# Patient Record
Sex: Male | Born: 1980 | Race: White | Hispanic: No | Marital: Married
Health system: Southern US, Community
[De-identification: ages and names within clinical notes are randomized; demographics above are authoritative.]

## PROBLEM LIST (undated history)

## (undated) DIAGNOSIS — K219 Gastro-esophageal reflux disease without esophagitis: Secondary | ICD-10-CM

## (undated) DIAGNOSIS — I10 Essential (primary) hypertension: Secondary | ICD-10-CM

## (undated) DIAGNOSIS — G4733 Obstructive sleep apnea (adult) (pediatric): Secondary | ICD-10-CM

## (undated) HISTORY — DX: Obstructive sleep apnea (adult) (pediatric): G47.33

## (undated) HISTORY — PX: VASECTOMY: SHX75

## (undated) HISTORY — DX: Gastro-esophageal reflux disease without esophagitis: K21.9

## (undated) HISTORY — DX: Essential (primary) hypertension: I10

---

## 2003-02-06 ENCOUNTER — Emergency Department (HOSPITAL_COMMUNITY): Admission: EM | Admit: 2003-02-06 | Discharge: 2003-02-06 | Payer: Self-pay | Admitting: Emergency Medicine

## 2015-07-11 ENCOUNTER — Other Ambulatory Visit: Payer: Self-pay | Admitting: Family Medicine

## 2015-07-11 DIAGNOSIS — M25511 Pain in right shoulder: Secondary | ICD-10-CM

## 2015-07-15 HISTORY — PX: SHOULDER SURGERY: SHX246

## 2015-07-27 ENCOUNTER — Ambulatory Visit
Admission: RE | Admit: 2015-07-27 | Discharge: 2015-07-27 | Disposition: A | Discharge status: Home / Self Care | Payer: PRIVATE HEALTH INSURANCE | Source: Ambulatory Visit | Attending: Family Medicine | Admitting: Family Medicine

## 2015-07-27 DIAGNOSIS — M25511 Pain in right shoulder: Secondary | ICD-10-CM

## 2015-07-27 MED ORDER — IOHEXOL 180 MG/ML  SOLN
14.0000 mL | Freq: Once | INTRAMUSCULAR | Status: AC | PRN
  Administered 2015-07-27: 14 mL via INTRA_ARTICULAR

## 2015-07-31 ENCOUNTER — Other Ambulatory Visit: Payer: Self-pay | Admitting: Orthopedic Surgery

## 2015-07-31 DIAGNOSIS — M542 Cervicalgia: Secondary | ICD-10-CM

## 2015-08-03 ENCOUNTER — Ambulatory Visit
Admission: RE | Admit: 2015-08-03 | Discharge: 2015-08-03 | Disposition: A | Discharge status: Home / Self Care | Payer: PRIVATE HEALTH INSURANCE | Source: Ambulatory Visit | Attending: Orthopedic Surgery | Admitting: Orthopedic Surgery

## 2015-08-03 DIAGNOSIS — M542 Cervicalgia: Secondary | ICD-10-CM

## 2015-09-03 ENCOUNTER — Other Ambulatory Visit (HOSPITAL_COMMUNITY): Payer: Self-pay | Admitting: Orthopedic Surgery

## 2015-09-03 ENCOUNTER — Ambulatory Visit (HOSPITAL_COMMUNITY)
Admission: RE | Admit: 2015-09-03 | Discharge: 2015-09-03 | Disposition: A | Discharge status: Home / Self Care | Payer: PRIVATE HEALTH INSURANCE | Source: Ambulatory Visit | Attending: Orthopedic Surgery | Admitting: Orthopedic Surgery

## 2015-09-03 DIAGNOSIS — R52 Pain, unspecified: Secondary | ICD-10-CM

## 2015-09-03 DIAGNOSIS — M7989 Other specified soft tissue disorders: Secondary | ICD-10-CM | POA: Diagnosis not present

## 2015-09-03 DIAGNOSIS — M79605 Pain in left leg: Secondary | ICD-10-CM | POA: Diagnosis not present

## 2015-09-03 DIAGNOSIS — M79604 Pain in right leg: Secondary | ICD-10-CM | POA: Diagnosis not present

## 2015-09-03 DIAGNOSIS — R609 Edema, unspecified: Secondary | ICD-10-CM

## 2015-09-03 NOTE — Progress Notes (Signed)
VASCULAR LAB PRELIMINARY  PRELIMINARY  PRELIMINARY  PRELIMINARY  Bilateral lower extremity venous duplex completed.    Preliminary report:  Bilateral:  No evidence of DVT, superficial thrombosis, or Baker's Cyst.   Jamice Carreno, RVS 09/03/2015, 2:06 PM

## 2017-01-26 IMAGING — XA DG FLUORO GUIDE NDL PLC/BX
1 series · 5 of 5 positions shown · non-contrast
Comparison: none

CLINICAL DATA: Right shoulder pain.

[Series 1: ortho adipose · 5 of 5 slices shown]
[im 1/5]
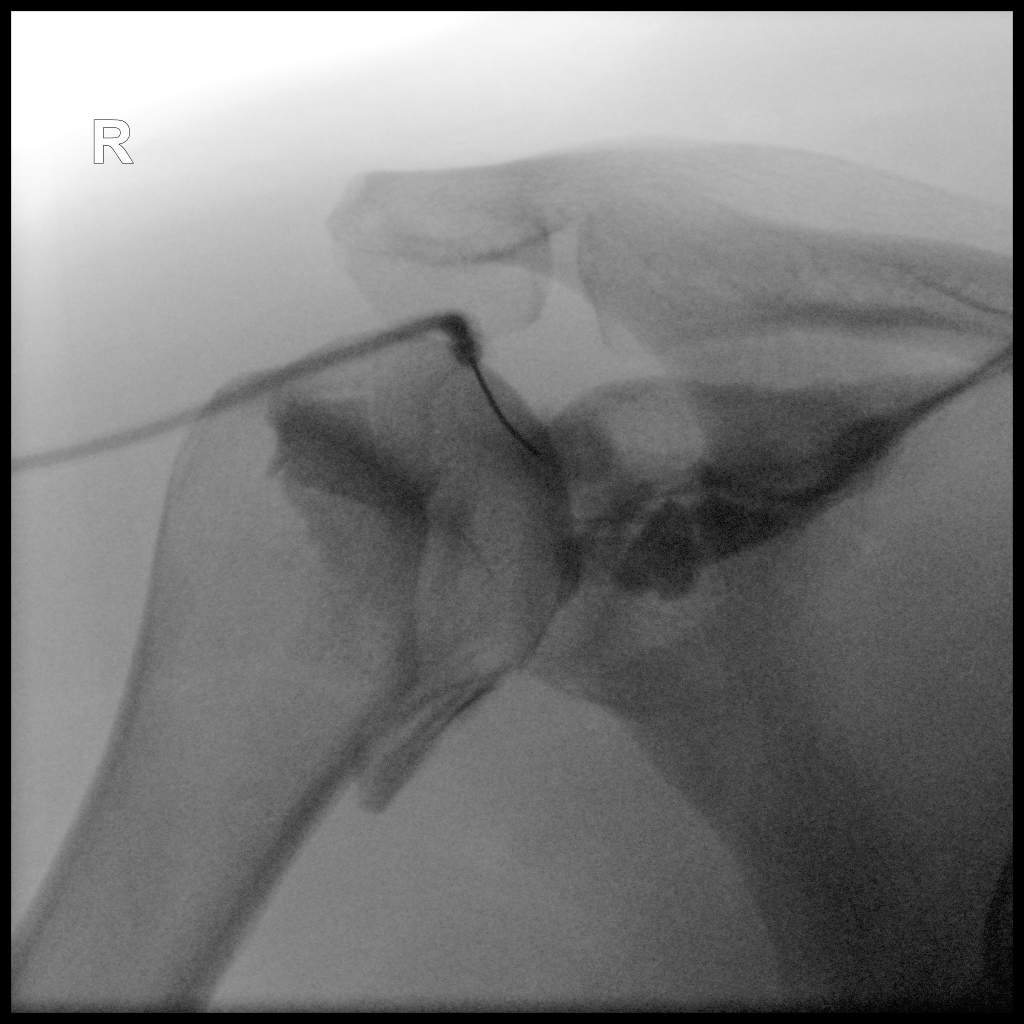
[im 2/5]
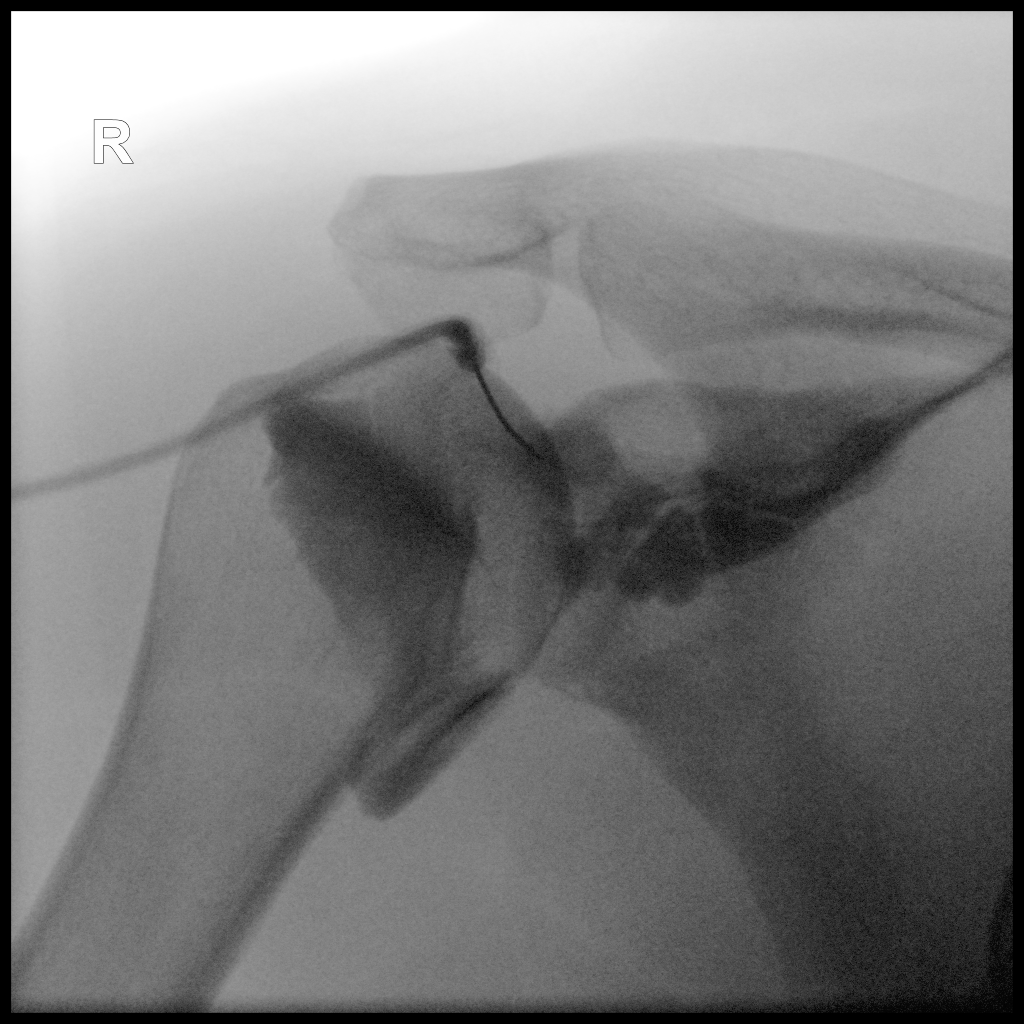
[im 3/5]
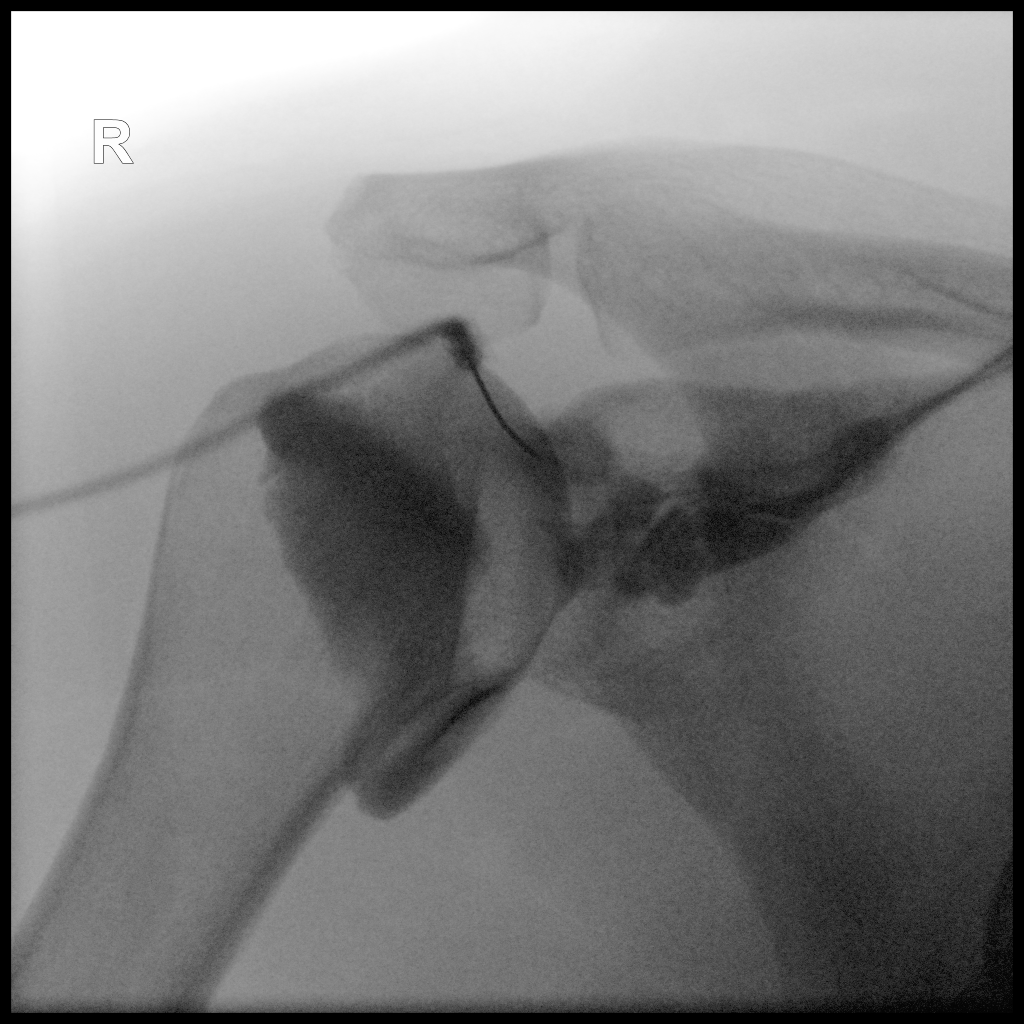
[im 4/5]
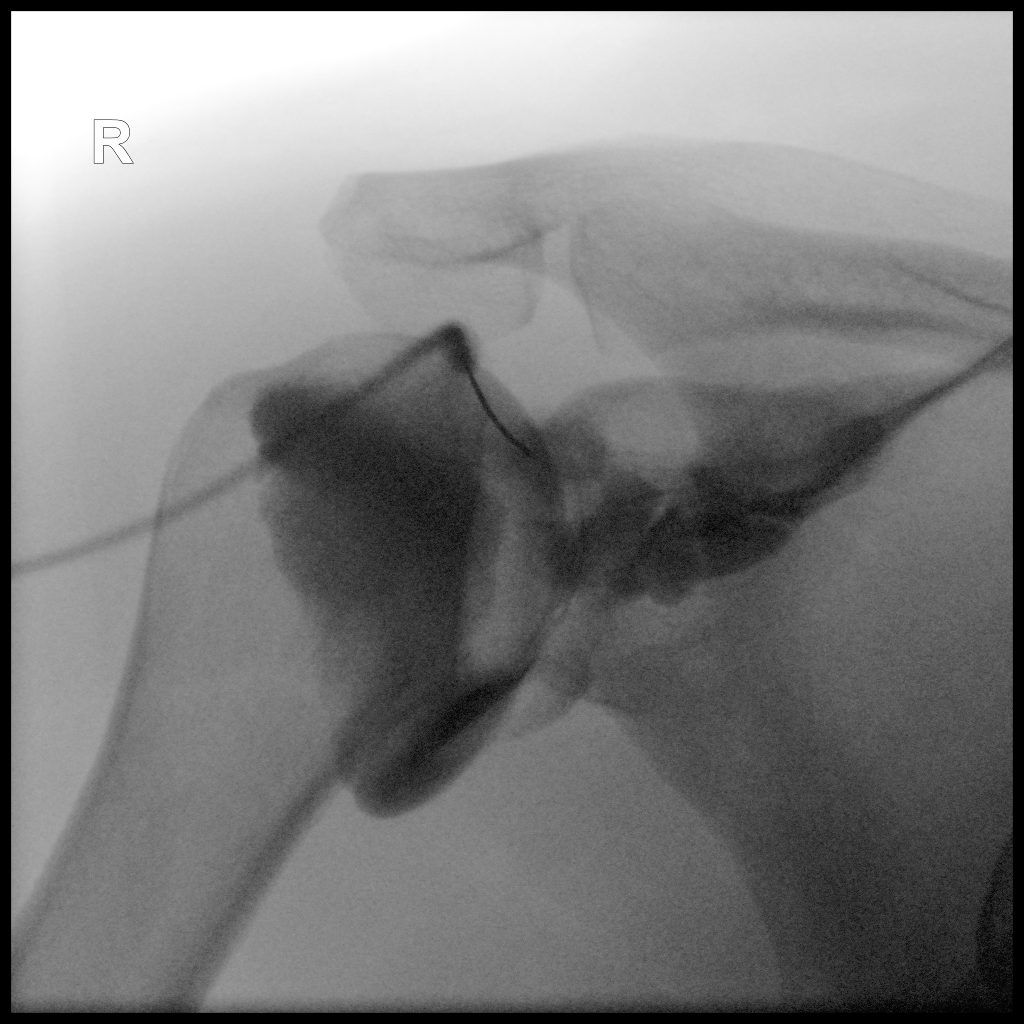
[im 5/5]
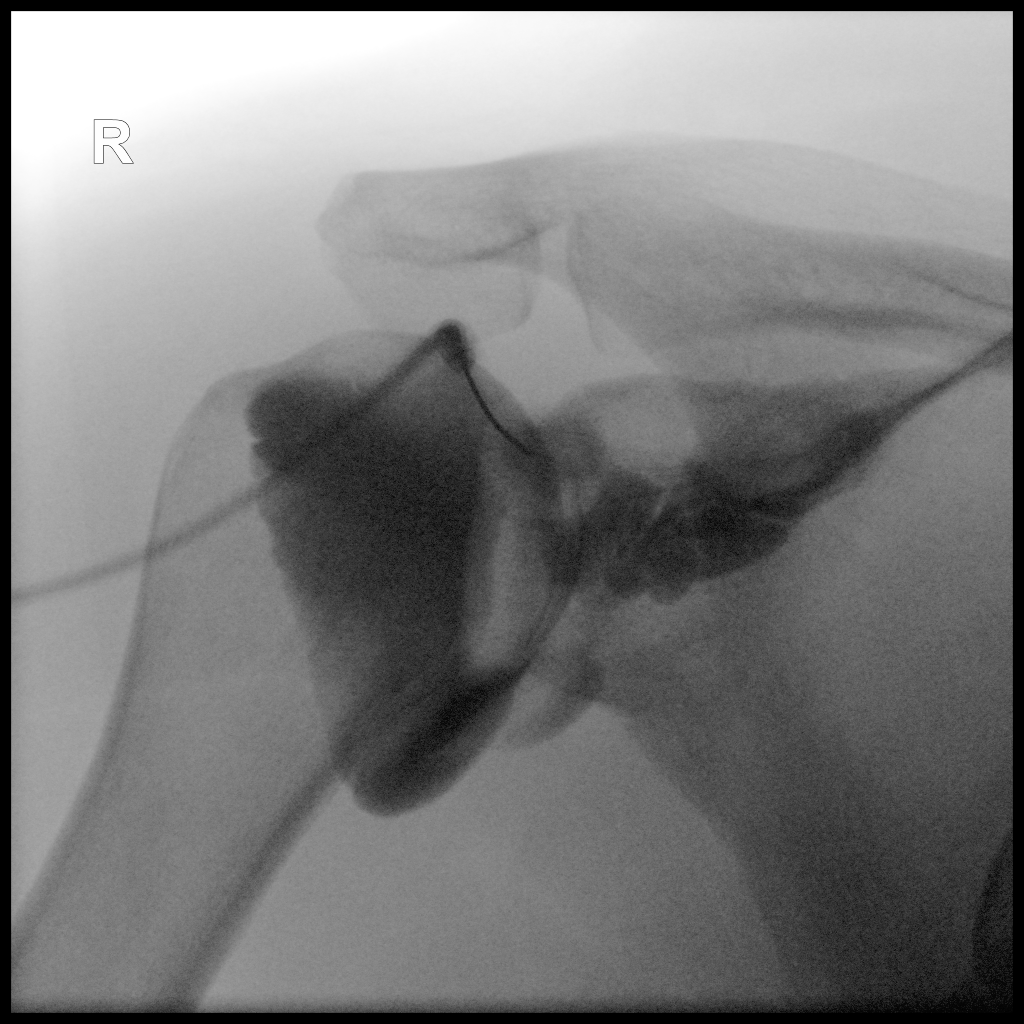

[5 of 5 positions shown; findings below may reference images not displayed]

FLUOROSCOPY TIME:  Radiation Exposure Index (as provided by the
fluoroscopic device): 25.90 microGray*m^2

Fluoroscopy Time (in minutes and seconds):  0 minutes 7 seconds

PROCEDURE:
RIGHT SHOULDER INJECTION UNDER FLUOROSCOPY

An appropriate skin entrance site was determined. The site was
marked, prepped with Betadine, draped in the usual sterile fashion,
and infiltrated locally with buffered Lidocaine. A 3.5 inch, 22
gauge spinal needle was advanced to the superomedial margin of the
humeral head under intermittent fluoroscopy. 1 ml of Lidocaine
injected easily. A mixture of 0.05 mL of MultiHance, 5 mL of 1%
lidocaine, and 15 mL of Isovue-M 200 was then used to opacify the
right shoulder capsule. 14 mL total of this mixture was injected. No
immediate complication.
IMPRESSION: Technically successful right shoulder injection for MRI.

## 2017-01-26 IMAGING — MR MR SHOULDER*R* W/CM
6 series · 40 of 40 positions shown · IV contrast (agent unspecified)
Comparison: Injection images same date.  Radiographs 06/20/2015.

CLINICAL DATA: Shoulder pain with motion for 6-8 months. History of
right pectoralis muscle tear and heavy weight lifting. No previous
relevant surgery.

EXAM:
MR ARTHROGRAM OF THE RIGHT SHOULDER
TECHNIQUE: Multiplanar, multisequence MR imaging of the right shoulder was
performed following the administration of intra-articular contrast.
CONTRAST:  See Injection Documentation.

[Series 4: T1 fat-sat · axial · 4.0mm · 0.27mm/px · z∈[+34,+114]mm · 6 of 18 slices shown (1 of 4)]
[im 1/18]
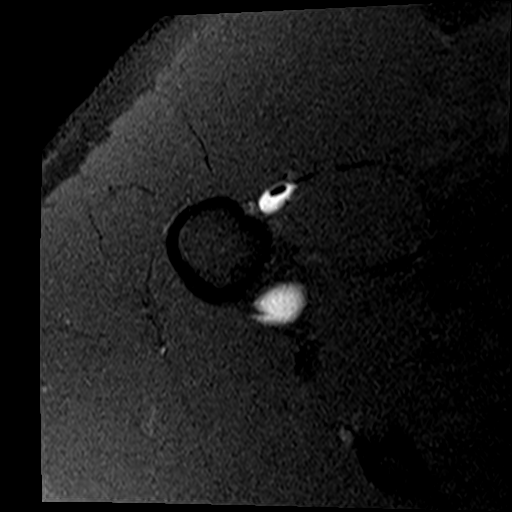
[im 4/18]
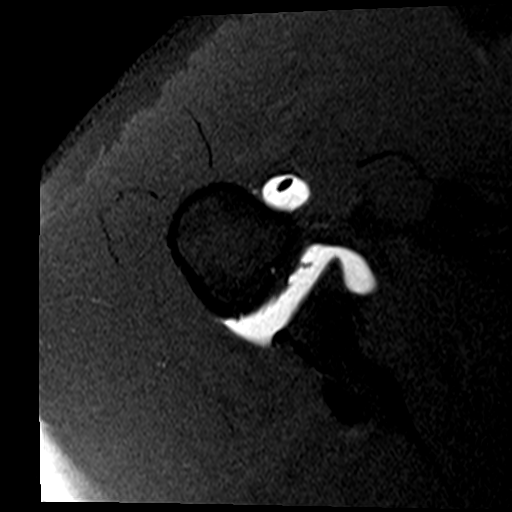
[im 7/18]
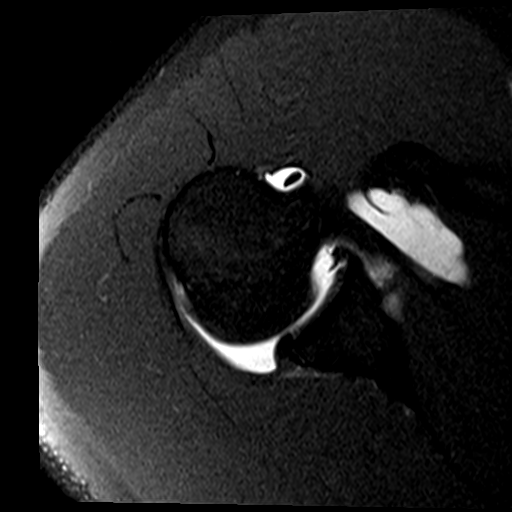
[im 11/18]
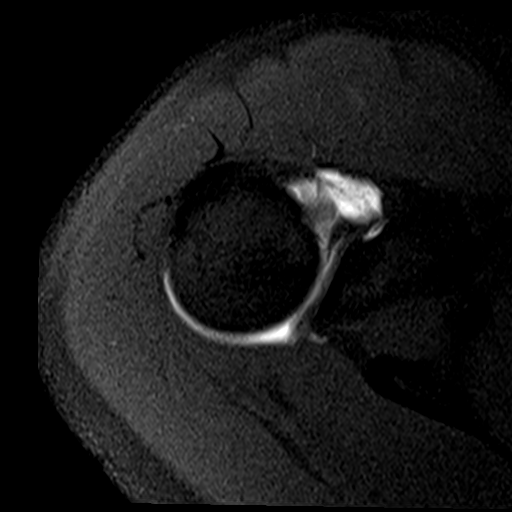
[im 14/18]
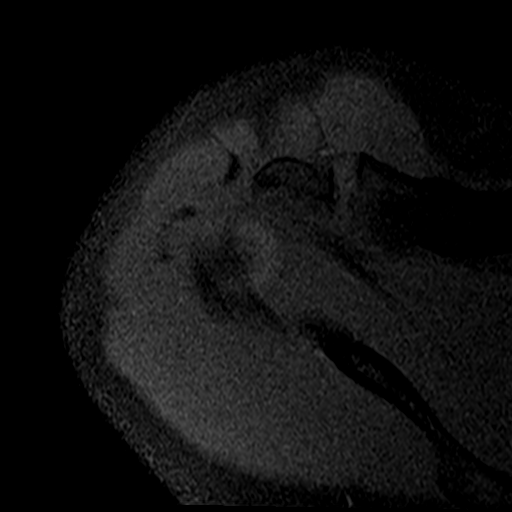
[im 18/18]
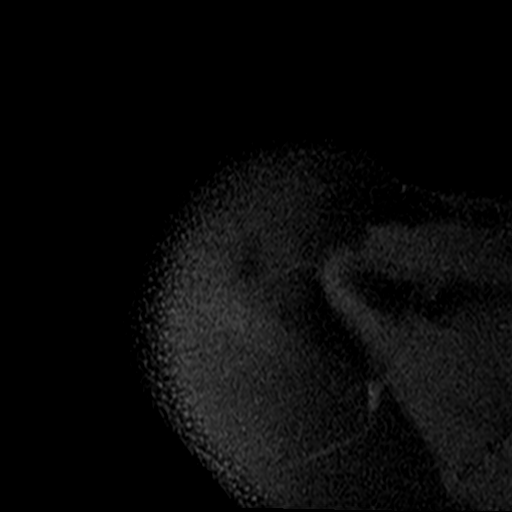

[Series 5: T2 fat-sat · oblique · 4.0mm · 0.55mm/px · 7 of 19 slices shown (1 of 2)]
[im 1/19]
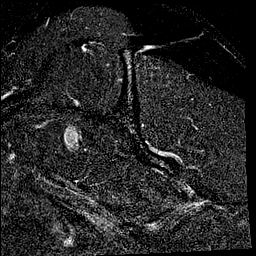
[im 4/19]
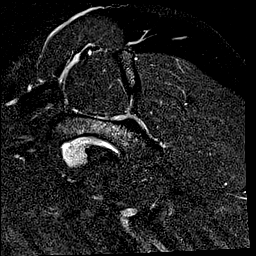
[im 7/19]
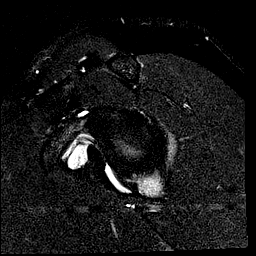
[im 10/19]
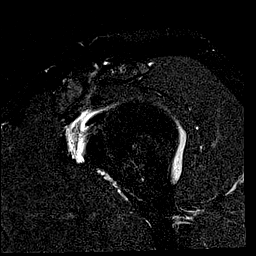
[im 13/19]
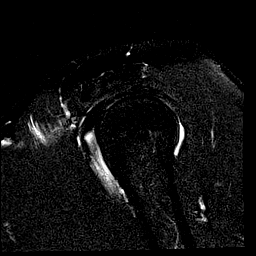
[im 16/19]
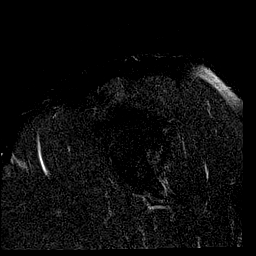
[im 19/19]
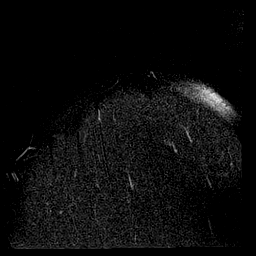

[Series 6: T1 fat-sat · oblique · 4.0mm · 0.44mm/px · 7 of 17 slices shown (2 of 4)]
[im 1/17]
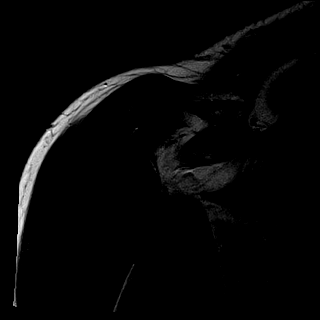
[im 3/17]
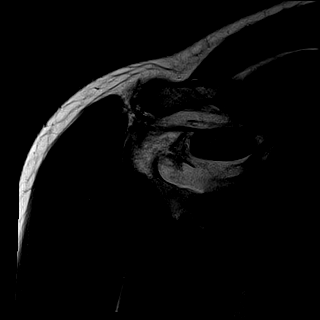
[im 6/17]
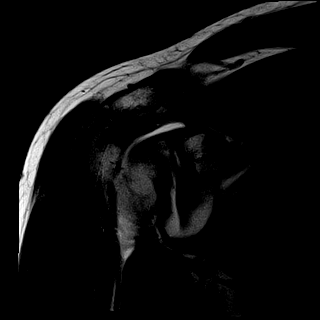
[im 9/17]
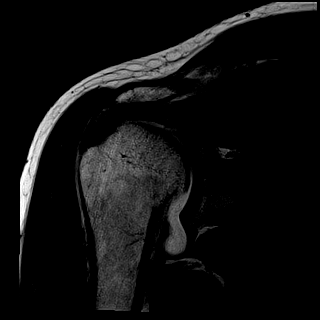
[im 11/17]
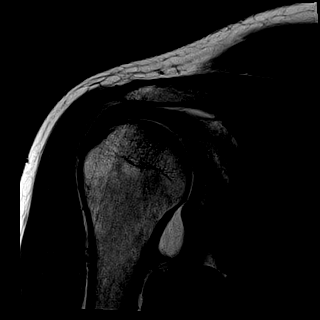
[im 14/17]
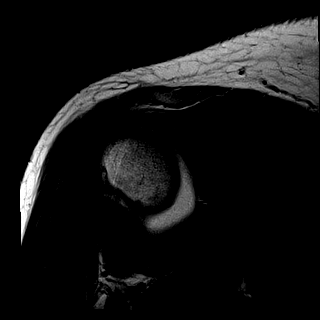
[im 17/17]
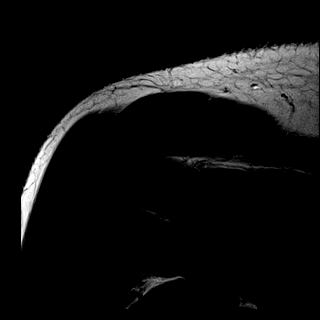

[Series 7: T1 fat-sat · oblique · 4.0mm · 0.55mm/px · 7 of 17 slices shown (3 of 4)]
[im 1/17]
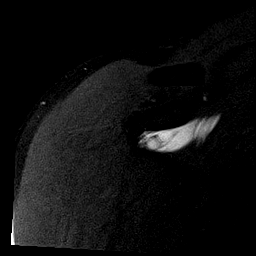
[im 3/17]
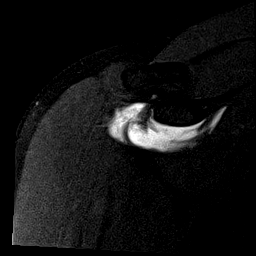
[im 6/17]
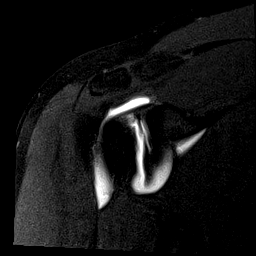
[im 9/17]
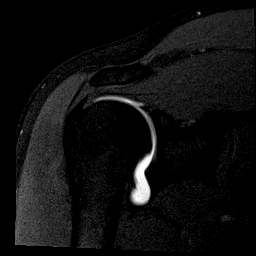
[im 11/17]
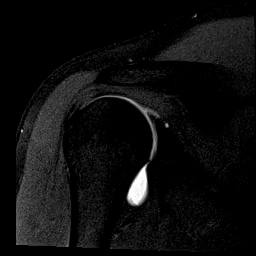
[im 14/17]
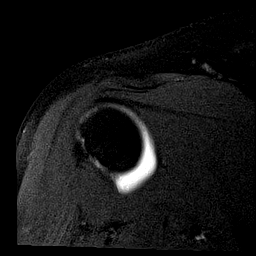
[im 17/17]
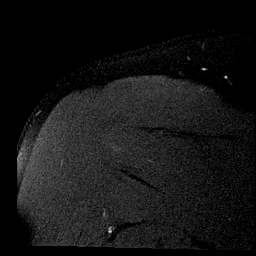

[Series 8: T2 fat-sat · oblique · 4.0mm · 0.55mm/px · 6 of 16 slices shown (2 of 2)]
[im 1/16]
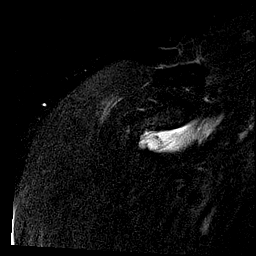
[im 4/16]
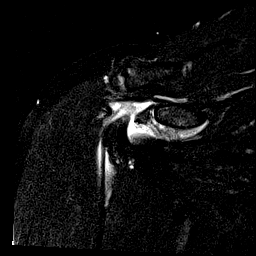
[im 7/16]
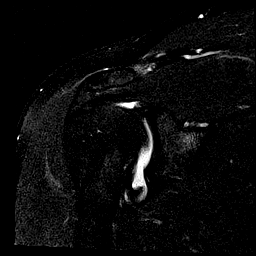
[im 10/16]
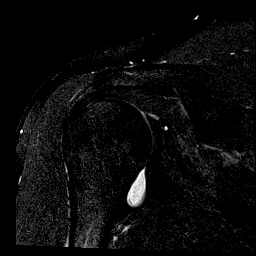
[im 13/16]
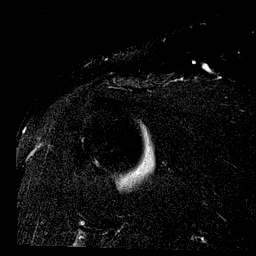
[im 16/16]
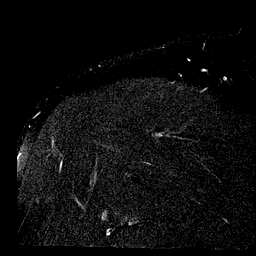

[Series 11: T1 fat-sat · sagittal · 4.0mm · 0.59mm/px · 7 of 17 slices shown (4 of 4)]
[im 1/17]
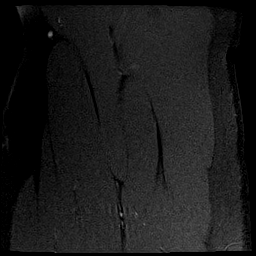
[im 3/17]
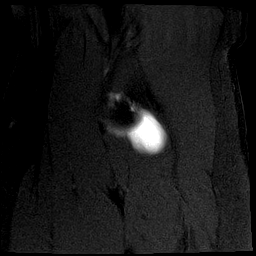
[im 6/17]
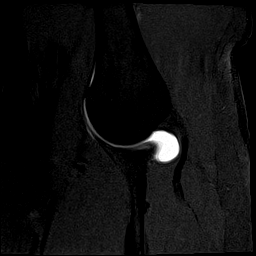
[im 9/17]
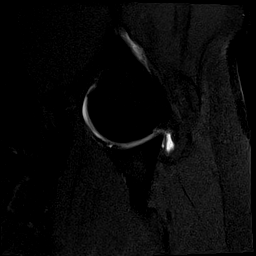
[im 11/17]
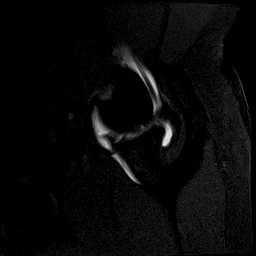
[im 14/17]
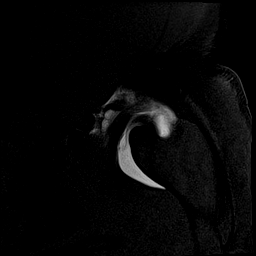
[im 17/17]
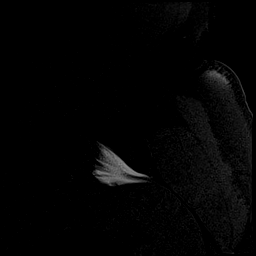

[40 of 40 positions shown; findings below may reference images not displayed]

FINDINGS: Rotator cuff: Intact without significant tendinosis. The
undersurface of the rotator cuff is smooth.

Muscles: The shoulder musculature is hypertrophied. No focal
muscular atrophy or edema.

Biceps long head:  Intact and normally positioned.

Acromioclavicular Joint: The acromion is type 2. There are mild
acromioclavicular degenerative changes. No significant fluid is
present in the subacromial - subdeltoid bursa.

Glenohumeral Joint: The shoulder joint is adequately distended with
contrast. No intra-articular loose bodies or focal chondral defects
identified.

Labrum: There is a nondisplaced tear of the posterior labrum
associated with a tiny paralabral cyst at approximately 11 o'clock.
This tear likely extends along the inferior aspect of the labrum,
best seen on the ABER images. The superior labrum appears intact.

Bones: No acute or significant extra-articular osseous findings. No
signs of previous shoulder dislocation.
IMPRESSION: 1. Nondisplaced posterior inferior labral tear, likely extending
from approximately [DATE] to [DATE]. Small paralabral cyst posteriorly.
2. The rotator cuff and biceps tendon appear normal.
3. Mild acromioclavicular degenerative changes.

## 2018-06-04 ENCOUNTER — Other Ambulatory Visit: Payer: Self-pay | Admitting: Family Medicine

## 2018-06-04 DIAGNOSIS — M545 Low back pain, unspecified: Secondary | ICD-10-CM

## 2018-06-04 DIAGNOSIS — M5126 Other intervertebral disc displacement, lumbar region: Secondary | ICD-10-CM

## 2018-06-14 ENCOUNTER — Ambulatory Visit
Admission: RE | Admit: 2018-06-14 | Discharge: 2018-06-14 | Disposition: A | Discharge status: Home / Self Care | Payer: PRIVATE HEALTH INSURANCE | Source: Ambulatory Visit | Attending: Family Medicine | Admitting: Family Medicine

## 2018-06-14 DIAGNOSIS — M545 Low back pain, unspecified: Secondary | ICD-10-CM

## 2018-06-14 DIAGNOSIS — M5126 Other intervertebral disc displacement, lumbar region: Secondary | ICD-10-CM

## 2018-07-26 ENCOUNTER — Other Ambulatory Visit: Payer: Self-pay | Admitting: Family Medicine

## 2018-07-26 DIAGNOSIS — M542 Cervicalgia: Secondary | ICD-10-CM

## 2018-07-26 DIAGNOSIS — M5412 Radiculopathy, cervical region: Secondary | ICD-10-CM

## 2018-08-02 ENCOUNTER — Ambulatory Visit
Admission: RE | Admit: 2018-08-02 | Discharge: 2018-08-02 | Disposition: A | Discharge status: Home / Self Care | Payer: PRIVATE HEALTH INSURANCE | Source: Ambulatory Visit | Attending: Family Medicine | Admitting: Family Medicine

## 2018-08-02 DIAGNOSIS — M542 Cervicalgia: Secondary | ICD-10-CM

## 2018-08-02 DIAGNOSIS — M5412 Radiculopathy, cervical region: Secondary | ICD-10-CM

## 2022-06-09 ENCOUNTER — Other Ambulatory Visit: Payer: Self-pay | Admitting: Family Medicine

## 2022-06-09 DIAGNOSIS — M545 Low back pain, unspecified: Secondary | ICD-10-CM

## 2022-06-09 DIAGNOSIS — M5416 Radiculopathy, lumbar region: Secondary | ICD-10-CM

## 2022-06-09 DIAGNOSIS — M5126 Other intervertebral disc displacement, lumbar region: Secondary | ICD-10-CM

## 2022-07-03 ENCOUNTER — Ambulatory Visit
Admission: RE | Admit: 2022-07-03 | Discharge: 2022-07-03 | Disposition: A | Discharge status: Home / Self Care | Payer: PRIVATE HEALTH INSURANCE | Source: Ambulatory Visit | Attending: Family Medicine | Admitting: Family Medicine

## 2022-07-03 DIAGNOSIS — M5126 Other intervertebral disc displacement, lumbar region: Secondary | ICD-10-CM

## 2022-07-03 DIAGNOSIS — M545 Low back pain, unspecified: Secondary | ICD-10-CM

## 2022-07-03 DIAGNOSIS — M5416 Radiculopathy, lumbar region: Secondary | ICD-10-CM

## 2022-07-14 HISTORY — PX: WISDOM TOOTH EXTRACTION: SHX21

## 2022-11-03 ENCOUNTER — Other Ambulatory Visit (HOSPITAL_BASED_OUTPATIENT_CLINIC_OR_DEPARTMENT_OTHER): Payer: Self-pay

## 2022-11-03 DIAGNOSIS — R5383 Other fatigue: Secondary | ICD-10-CM

## 2022-11-03 DIAGNOSIS — G4710 Hypersomnia, unspecified: Secondary | ICD-10-CM

## 2022-11-03 DIAGNOSIS — R0681 Apnea, not elsewhere classified: Secondary | ICD-10-CM

## 2022-11-17 ENCOUNTER — Ambulatory Visit: Payer: PRIVATE HEALTH INSURANCE | Attending: Family Medicine | Admitting: Pulmonary Disease

## 2022-11-17 DIAGNOSIS — R0681 Apnea, not elsewhere classified: Secondary | ICD-10-CM

## 2022-11-17 DIAGNOSIS — G4733 Obstructive sleep apnea (adult) (pediatric): Secondary | ICD-10-CM | POA: Insufficient documentation

## 2022-11-17 DIAGNOSIS — G4710 Hypersomnia, unspecified: Secondary | ICD-10-CM

## 2022-11-17 DIAGNOSIS — R5383 Other fatigue: Secondary | ICD-10-CM

## 2022-11-20 DIAGNOSIS — R0681 Apnea, not elsewhere classified: Secondary | ICD-10-CM

## 2022-11-20 NOTE — Procedures (Signed)
     Patient Name: Jonathan Krause, Jesmer Date: 11/17/2022 Gender: Male D.O.B: [birth year] Age (years): 41 Referring Provider: Richardean Chimera Height (inches): 72 Interpreting Physician: Coralyn Helling MD, ABSM Weight (lbs): 267 RPSGT: Alfonso Ellis BMI: 36 MRN: [id number]  CLINICAL INFORMATION Sleep Study Type: HST  Indication for sleep study: snoring, sleep disruption and daytime sleepiness.  SLEEP STUDY TECHNIQUE A multi-channel overnight portable sleep study was performed. The channels recorded were: nasal airflow, thoracic respiratory movement, and oxygen saturation with a pulse oximetry. Snoring was also monitored.  MEDICATIONS Patient self administered medications include: N/A.  SLEEP ARCHITECTURE Patient was studied for 312.9 minutes. The sleep efficiency was 65.2 % and the patient was supine for 0%. The arousal index was 0.0 per hour.  RESPIRATORY PARAMETERS The overall AHI was 12.5 per hour, with a central apnea index of 0 per hour.  The oxygen nadir was 89% during sleep.  CARDIAC DATA Mean heart rate during sleep was 65.1 bpm.  IMPRESSIONS - Mild obstructive sleep apnea occurred during this study (AHI = 12.5/h). - Mild oxygen desaturation was noted during this study (Min O2 = 89%). - Patient snored 23% during the sleep.  DIAGNOSIS - Obstructive Sleep Apnea (G47.33)  RECOMMENDATIONS - Additional therapies include CPAP, oral appliance or surgical assessment. - Avoid alcohol, sedatives and other CNS depressants that may worsen sleep apnea and disrupt normal sleep architecture. - Sleep hygiene should be reviewed to assess factors that may improve sleep quality. - Weight management and regular exercise should be initiated or continued. - Sleep medicine consultation available as needed to assist with management.  [Electronically signed] 11/20/2022 01:59 PM  Coralyn Helling MD, ABSM Diplomate, American Board of Sleep Medicine NPI: [id number]  [facility] PH: [phone]   FX: [phone] ACCREDITED BY THE AMERICAN ACADEMY OF SLEEP MEDICINE

## 2023-09-16 ENCOUNTER — Encounter (HOSPITAL_BASED_OUTPATIENT_CLINIC_OR_DEPARTMENT_OTHER): Payer: Self-pay | Admitting: Family

## 2023-09-16 NOTE — Progress Notes (Signed)
 Cardiology Office Note:  .   Date:  09/19/2023  ID:  Jonathan Krause, DOB 1980-08-14, MRN [id number] PCP: Juliette Alcide, MD  [facility] Providers Cardiologist:  None Cardiology APP:  Alver Sorrow, NP    History of Present Illness: .   Jonathan Krause is a 43 y.o. male with history of hypertension, OSA on CPAP, GERD.   Presents today to establish with cardiology. Referred by his PCP due to dyspnea notable with bending over, leg swelling. Family history notable for atrial fibrillation in his father, hypertension in his mother. Tobacco use never. Alcohol use socially.   Dyspnea onset late 2024 with activity like tying his shoes. Additionally describes feeling he can't get a deep breath both at rest or with activity.   Was diagnoesd with high blood pressure as high as 200 at home. Symtpomatic with feeling "strange pressure" in sides of his head. BP control improved with addition of Olmesartan by PCP. Endorses wearing CPAP regularly but finds the mask frustrating.   Frustrated by weight gain in setting of inactivity due to dyspnea. Eats two meals per day. Greek yogurt or eggs late afternoon then dinner. Has cut back on coffee - presently drinking 2-3 shots of espresso and then 80-100oz of  water per day.   Notes palpitations with sensation of heart racing "out of nowhere" or will skip some beats. This has been going on some time but feels more frequent recently. Does note dizziness with position changes.   Notes bilateral LE edema with no clear temporal factors.   Review of Systems  Constitutional: Negative for chills, fever and malaise/fatigue.  Cardiovascular:  Positive for dyspnea on exertion, leg swelling and palpitations. Negative for chest pain, near-syncope, orthopnea and syncope.  Respiratory:  Positive for shortness of breath. Negative for cough and wheezing.   Gastrointestinal:  Negative for nausea and vomiting.  Neurological:  Positive for dizziness. Negative for  light-headedness and weakness.     Studies Reviewed: Marland Kitchen   EKG Interpretation Date/Time:  Friday September 18 2023 09:22:37 EST Ventricular Rate:  71 PR Interval:  166 QRS Duration:  116 QT Interval:  372 QTC Calculation: 404 R Axis:   19  Text Interpretation: Normal sinus rhythm Confirmed by Gillian Shields ([id number]) on 09/18/2023 9:39:06 AM        Risk Assessment/Calculations:             Physical Exam:   VS:  BP 126/84 (BP Location: Left Arm, Patient Position: Sitting, Cuff Size: Large)   Pulse 71   Ht 6' (1.829 m)   Wt 282 lb 11.2 oz (128.2 kg)   SpO2 95%   BMI 38.34 kg/m    Wt Readings from Last 3 Encounters:  09/18/23 282 lb 11.2 oz (128.2 kg)    GEN: Well nourished, well developed in no acute distress NECK: No JVD; No carotid bruits CARDIAC: RRR, no murmurs, rubs, gallops RESPIRATORY:  Clear to auscultation without rales, wheezing or rhonchi  ABDOMEN: Soft, non-tender, non-distended EXTREMITIES:  Trace pretibial edema; No deformity   ASSESSMENT AND PLAN: .    HTN - BP well controlled. Continue current antihypertensive regimen Olmesartan 40mg  daily. Managed by PCP.   Dyspnea / LE edema - 4 month history of dyspnea with bending over, with activity, and at rest with sensation of not being able to get a deep breath as well as LE edema. Plan for echocardiogram.   HLD - 03/2023 LDL 117. Update lipid panel, Lp(a) for assessment of cardiovascular risk.  Palpitations - Longstanding but recently worsened. Encouraged staying well hydrated, further decreasing caffeine. CBC, CMP, magnesium today to assess for anemia or electrolyte abnormality. Discussed monitor but as many of his tests end up being self pay with his insurance plan to monitor symptoms for now.  OSA - CPAP compliance encouraged.        Dispo: follow up in 2-3 months  Signed, Alver Sorrow, NP

## 2023-09-18 ENCOUNTER — Encounter (HOSPITAL_BASED_OUTPATIENT_CLINIC_OR_DEPARTMENT_OTHER): Payer: Self-pay | Admitting: Family

## 2023-09-18 ENCOUNTER — Ambulatory Visit (HOSPITAL_BASED_OUTPATIENT_CLINIC_OR_DEPARTMENT_OTHER): Payer: PRIVATE HEALTH INSURANCE | Admitting: Family

## 2023-09-18 VITALS — BP 126/84 | HR 71 | Ht 72.0 in | Wt 282.7 lb

## 2023-09-18 DIAGNOSIS — I10 Essential (primary) hypertension: Secondary | ICD-10-CM | POA: Diagnosis not present

## 2023-09-18 DIAGNOSIS — G4733 Obstructive sleep apnea (adult) (pediatric): Secondary | ICD-10-CM

## 2023-09-18 DIAGNOSIS — R0609 Other forms of dyspnea: Secondary | ICD-10-CM

## 2023-09-18 DIAGNOSIS — R600 Localized edema: Secondary | ICD-10-CM

## 2023-09-18 DIAGNOSIS — E782 Mixed hyperlipidemia: Secondary | ICD-10-CM | POA: Diagnosis not present

## 2023-09-18 DIAGNOSIS — R002 Palpitations: Secondary | ICD-10-CM

## 2023-09-18 NOTE — Patient Instructions (Signed)
 Medication Instructions:  Your physician recommends that you continue on your current medications as directed. Please refer to the Current Medication list given to you today.  *If you need a refill on your cardiac medications before your next appointment, please call your pharmacy*   Lab Work: Your physician recommends that you return for lab work today- Lipid Panel, CMP, CBC, Mag, & LP(a)    Testing/Procedures: Your physician has requested that you have an echocardiogram. Echocardiography is a painless test that uses sound waves to create images of your heart. It provides your doctor with information about the size and shape of your heart and how well your heart's chambers and valves are working. This procedure takes approximately one hour. There are no restrictions for this procedure. Please do NOT wear cologne, perfume, aftershave, or lotions (deodorant is allowed). Please arrive 15 minutes prior to your appointment time.  Please note: We ask at that you not bring children with you during ultrasound (echo/ vascular) testing. Due to room size and safety concerns, children are not allowed in the ultrasound rooms during exams. Our front office staff cannot provide observation of children in our lobby area while testing is being conducted. An adult accompanying a patient to their appointment will only be allowed in the ultrasound room at the discretion of the ultrasound technician under special circumstances. We apologize for any inconvenience.   Follow-Up: At [facility], you and your health needs are our priority.  As part of our continuing mission to provide you with exceptional heart care, we have created designated Provider Care Teams.  These Care Teams include your primary Cardiologist (physician) and Advanced Practice Providers (APPs -  Physician Assistants and Nurse Practitioners) who all work together to provide you with the care you need, when you need it.  We recommend  signing up for the patient portal called "MyChart".  Sign up information is provided on this After Visit Summary.  MyChart is used to connect with patients for Virtual Visits (Telemedicine).  Patients are able to view lab/test results, encounter notes, upcoming appointments, etc.  Non-urgent messages can be sent to your provider as well.   To learn more about what you can do with MyChart, go to [email or web address].    Your next appointment:   2-3 months with Gillian Shields, NP   Other Instructions To prevent palpitations: Make sure you are adequately hydrated.  Avoid and/or limit caffeine containing beverages like soda or tea. Exercise regularly.  Manage stress well. Some over the counter medications can cause palpitations such as Benadryl, AdvilPM, TylenolPM. Regular Advil or Tylenol do not cause palpitations.

## 2023-09-19 ENCOUNTER — Encounter (HOSPITAL_BASED_OUTPATIENT_CLINIC_OR_DEPARTMENT_OTHER): Payer: Self-pay | Admitting: Family

## 2023-09-20 LAB — CBC
Hematocrit: 46.9 % (ref 37.5–51.0)
Hemoglobin: 15.8 g/dL (ref 13.0–17.7)
MCH: 30.2 pg (ref 26.6–33.0)
MCHC: 33.7 g/dL (ref 31.5–35.7)
MCV: 90 fL (ref 79–97)
Platelets: 226 10*3/uL (ref 150–450)
RBC: 5.24 x10E6/uL (ref 4.14–5.80)
RDW: 13.1 % (ref 11.6–15.4)
WBC: 4.7 10*3/uL (ref 3.4–10.8)

## 2023-09-20 LAB — COMPREHENSIVE METABOLIC PANEL
ALT: 40 IU/L (ref 0–44)
AST: 33 IU/L (ref 0–40)
Albumin: 4.7 g/dL (ref 4.1–5.1)
Alkaline Phosphatase: 64 IU/L (ref 44–121)
BUN/Creatinine Ratio: 15 (ref 9–20)
BUN: 17 mg/dL (ref 6–24)
Bilirubin Total: 0.5 mg/dL (ref 0.0–1.2)
CO2: 23 mmol/L (ref 20–29)
Calcium: 9.8 mg/dL (ref 8.7–10.2)
Chloride: 104 mmol/L (ref 96–106)
Creatinine, Ser: 1.13 mg/dL (ref 0.76–1.27)
Globulin, Total: 2.3 g/dL (ref 1.5–4.5)
Glucose: 97 mg/dL (ref 70–99)
Potassium: 4.6 mmol/L (ref 3.5–5.2)
Sodium: 142 mmol/L (ref 134–144)
Total Protein: 7 g/dL (ref 6.0–8.5)
eGFR: 83 mL/min/{1.73_m2} (ref >59)

## 2023-09-20 LAB — LIPID PANEL
Chol/HDL Ratio: 4 ratio (ref 0.0–5.0)
Cholesterol, Total: 230 mg/dL — ABNORMAL HIGH (ref 100–199)
HDL: 58 mg/dL (ref >39)
LDL Chol Calc (NIH): 154 mg/dL — ABNORMAL HIGH (ref 0–99)
Triglycerides: 101 mg/dL (ref 0–149)
VLDL Cholesterol Cal: 18 mg/dL (ref 5–40)

## 2023-09-20 LAB — MAGNESIUM: Magnesium: 1.9 mg/dL (ref 1.6–2.3)

## 2023-09-20 LAB — LIPOPROTEIN A (LPA): Lipoprotein (a): <8.4 nmol/L (ref <75.0)

## 2023-09-21 ENCOUNTER — Telehealth (HOSPITAL_BASED_OUTPATIENT_CLINIC_OR_DEPARTMENT_OTHER): Payer: Self-pay

## 2023-09-21 NOTE — Telephone Encounter (Addendum)
 Called results to patient and left results on VM (ok per DPR), instructions left to call office back if patient has any questions!     ----- Message from Alver Sorrow sent at 09/21/2023  8:19 AM EDT ----- Normal kidneys, liver, electrolytes, lipoprotein (a). CBC with no evidence of anemia nor infection.  Cholesterol elevated compared to previous. The 10-year ASCVD risk score (Arnett DK, et al., 2019) is: 1.9%. Does not require cholesterol lowering therapy at this time. Recommend lifestyle changes:  aiming for 150 minutes of moderate intensity activity per week and following a heart healthy diet.  Can mail cholesterol-lowering diet resources if he wishes.

## 2023-10-07 ENCOUNTER — Ambulatory Visit (HOSPITAL_COMMUNITY)
Admission: RE | Admit: 2023-10-07 | Discharge: 2023-10-07 | Disposition: A | Discharge status: Home / Self Care | Payer: Self-pay | Source: Ambulatory Visit | Attending: Family | Admitting: Family

## 2023-10-07 DIAGNOSIS — R002 Palpitations: Secondary | ICD-10-CM | POA: Insufficient documentation

## 2023-10-07 DIAGNOSIS — R0609 Other forms of dyspnea: Secondary | ICD-10-CM | POA: Insufficient documentation

## 2023-10-07 DIAGNOSIS — R600 Localized edema: Secondary | ICD-10-CM | POA: Insufficient documentation

## 2023-10-07 LAB — ECHOCARDIOGRAM COMPLETE
Area-P 1/2: 3.12 cm2
S' Lateral: 3.4 cm

## 2023-10-07 NOTE — Progress Notes (Signed)
*  PRELIMINARY RESULTS* Echocardiogram 2D Echocardiogram has been performed.  Stacey Drain 10/07/2023, 3:11 PM

## 2023-10-08 ENCOUNTER — Telehealth (HOSPITAL_BASED_OUTPATIENT_CLINIC_OR_DEPARTMENT_OTHER): Payer: Self-pay

## 2023-10-08 NOTE — Telephone Encounter (Addendum)
 Results called to patient who verbalizes understanding!     ----- Message from Jonathan Krause sent at 10/08/2023  9:59 AM EDT ----- Echo with normal heart muscle function. No valvular abnormalities. Good result!

## 2023-11-18 ENCOUNTER — Ambulatory Visit (HOSPITAL_BASED_OUTPATIENT_CLINIC_OR_DEPARTMENT_OTHER): Payer: PRIVATE HEALTH INSURANCE | Admitting: Cardiology

## 2023-12-28 ENCOUNTER — Ambulatory Visit (HOSPITAL_BASED_OUTPATIENT_CLINIC_OR_DEPARTMENT_OTHER): Payer: PRIVATE HEALTH INSURANCE | Admitting: Family

## 2024-01-19 ENCOUNTER — Encounter (HOSPITAL_BASED_OUTPATIENT_CLINIC_OR_DEPARTMENT_OTHER): Payer: Self-pay | Admitting: Family

## 2024-01-19 ENCOUNTER — Ambulatory Visit (INDEPENDENT_AMBULATORY_CARE_PROVIDER_SITE_OTHER): Payer: PRIVATE HEALTH INSURANCE | Admitting: Family

## 2024-01-19 VITALS — BP 122/70 | HR 74 | Resp 16 | Ht 72.0 in | Wt 270.6 lb

## 2024-01-19 DIAGNOSIS — G4733 Obstructive sleep apnea (adult) (pediatric): Secondary | ICD-10-CM

## 2024-01-19 DIAGNOSIS — I10 Essential (primary) hypertension: Secondary | ICD-10-CM

## 2024-01-19 DIAGNOSIS — R0789 Other chest pain: Secondary | ICD-10-CM

## 2024-01-19 NOTE — Patient Instructions (Addendum)
 Medication Instructions:  Continue your current medications  *If you need a refill on your cardiac medications before your next appointment, please call your pharmacy*  Follow-Up: At [facility], you and your health needs are our priority.  As part of our continuing mission to provide you with exceptional heart care, our providers are all part of one team.  This team includes your primary Cardiologist (physician) and Advanced Practice Providers or APPs (Physician Assistants and Nurse Practitioners) who all work together to provide you with the care you need, when you need it.  Your next appointment:   6 month(s)  Provider:   Reche Finder, NP    We recommend signing up for the patient portal called MyChart.  Sign up information is provided on this After Visit Summary.  MyChart is used to connect with patients for Virtual Visits (Telemedicine).  Patients are able to view lab/test results, encounter notes, upcoming appointments, etc.  Non-urgent messages can be sent to your provider as well.   To learn more about what you can do with MyChart, go to [email or web address].   Other Instructions  If your chest pain recurs or you are unable to increase physical activity, call to let us  know and we could consider a coronary calcium score which is a $99 test to look for any plaque build up in the heart arteries.      To prevent palpitations: Make sure you are adequately hydrated.  Avoid and/or limit caffeine containing beverages like soda or tea. Exercise regularly.  Manage stress well. Some over the counter medications can cause palpitations such as Benadryl, AdvilPM, TylenolPM. Regular Advil or Tylenol do not cause palpitations.

## 2024-01-19 NOTE — Progress Notes (Signed)
 Cardiology Office Note   Date:  01/19/2024  ID:  Jonathan Krause, DOB January 31, 1981, MRN [id number] PCP: Lari Elspeth BRAVO, MD  [facility] Providers Cardiologist:  None Cardiology APP:  Vannie Reche RAMAN, NP     History of Present Illness Jonathan Krause is a 43 y.o. male with history of hypertension, OSA on CPAP, GERD.    Established with cardiology 09/18/2023 due to dyspnea notable with bending over, leg swelling. Family history notable for atrial fibrillation in his father, hypertension in his mother. Tobacco use never. Alcohol use socially.  He also noted occasional palpitations with no clear aggravating or relieving factors.  Echocardiogram 10/01/2023 normal LVEF 55 to 60%, no RWMA, mild LVH, normal diastolic function, no significant valvular abnormalities.  History of Present Illness Jonathan Krause is a 43 year old male who presents with chest pain.  He experienced chest pain last Wednesday while on vacation, described as pressure in the chest with shortness of breath. The pain occurred while lying face down during a massage and resolved after about 30 minutes. He also felt a sensation like an electrical shock or pinch radiating to his left neck and shoulder. Since returning home, he has not had further chest pain.  During the vacation, his heart rate was elevated at 105-108 beats per minute without physical activity. He attributes some discomfort to dietary changes and increased alcohol consumption. His heart rate has since normalized.  He inconsistently takes olmesartan, usually in the late afternoon or evening. He experienced some shin swelling during the vacation, though he usually has mild edema in his shins.  ROS: Please see the history of present illness.    All other systems reviewed and are negative.   Studies Reviewed      Cardiac Studies & Procedures   ______________________________________________________________________________________________      ECHOCARDIOGRAM  ECHOCARDIOGRAM COMPLETE 10/07/2023  Narrative ECHOCARDIOGRAM REPORT    Patient Name:   Jonathan Krause Date of Exam: 10/07/2023 Medical Rec #:  [id number]       Height:       72.0 in Accession #:    [id number]      Weight:       282.7 lb Date of Birth:  1981-01-23       BSA:          2.468 m Patient Age:    42 years        BP:           145/89 mmHg Patient Gender: M               HR:           85 bpm. Exam Location:  [facility]  Procedure: 2D Echo, Cardiac Doppler and Color Doppler (Both Spectral and Color Flow Doppler were utilized during procedure).  Indications:    Dyspnea on exertion  History:        Patient has no prior history of Echocardiogram examinations. Risk Factors:Hypertension and Sleep Apnea.  Sonographer:    Aida Pizza RCS Referring Phys: [id number] Dariel Betzer S Lawrence Roldan  IMPRESSIONS   1. Left ventricular ejection fraction, by estimation, is 55 to 60%. The left ventricle has normal function. The left ventricle has no regional wall motion abnormalities. There is mild concentric left ventricular hypertrophy. Left ventricular diastolic parameters were normal. 2. Right ventricular systolic function is normal. The right ventricular size is normal. Tricuspid regurgitation signal is inadequate for assessing PA pressure. 3. The mitral valve is grossly normal. No evidence of mitral valve regurgitation.  4. The aortic valve is tricuspid. Aortic valve regurgitation is not visualized. 5. The inferior vena cava is normal in size with greater than 50% respiratory variability, suggesting right atrial pressure of 3 mmHg.  Comparison(s): No prior Echocardiogram.  FINDINGS Left Ventricle: Left ventricular ejection fraction, by estimation, is 55 to 60%. The left ventricle has normal function. The left ventricle has no regional wall motion abnormalities. The left ventricular internal cavity size was normal in size. There is mild concentric left ventricular hypertrophy.  Left ventricular diastolic parameters were normal.  Right Ventricle: The right ventricular size is normal. No increase in right ventricular wall thickness. Right ventricular systolic function is normal. Tricuspid regurgitation signal is inadequate for assessing PA pressure.  Left Atrium: Left atrial size was normal in size.  Right Atrium: Right atrial size was normal in size.  Pericardium: There is no evidence of pericardial effusion.  Mitral Valve: The mitral valve is grossly normal. No evidence of mitral valve regurgitation.  Tricuspid Valve: The tricuspid valve is grossly normal. Tricuspid valve regurgitation is trivial.  Aortic Valve: The aortic valve is tricuspid. There is mild aortic valve annular calcification. Aortic valve regurgitation is not visualized.  Pulmonic Valve: The pulmonic valve was grossly normal. Pulmonic valve regurgitation is trivial.  Aorta: The aortic root is normal in size and structure.  Venous: The inferior vena cava is normal in size with greater than 50% respiratory variability, suggesting right atrial pressure of 3 mmHg.  IAS/Shunts: No atrial level shunt detected by color flow Doppler.  Additional Comments: 3D was performed not requiring image post processing on an independent workstation and was indeterminate.   LEFT VENTRICLE PLAX 2D LVIDd:         5.40 cm   Diastology LVIDs:         3.40 cm   LV e' medial:    7.07 cm/s LV PW:         1.30 cm   LV E/e' medial:  10.2 LV IVS:        1.10 cm   LV e' lateral:   12.10 cm/s LVOT diam:     2.40 cm   LV E/e' lateral: 6.0 LV SV:         90 LV SV Index:   36 LVOT Area:     4.52 cm   RIGHT VENTRICLE RV S prime:     12.90 cm/s TAPSE (M-mode): 2.2 cm  LEFT ATRIUM             Index        RIGHT ATRIUM           Index LA diam:        4.30 cm 1.74 cm/m   RA Area:     19.10 cm LA Vol (A2C):   45.2 ml 18.31 ml/m  RA Volume:   58.80 ml  23.82 ml/m LA Vol (A4C):   71.7 ml 29.05 ml/m LA Biplane Vol:  58.6 ml 23.74 ml/m AORTIC VALVE LVOT Vmax:   100.00 cm/s LVOT Vmean:  60.000 cm/s LVOT VTI:    0.198 m  AORTA Ao Root diam: 3.60 cm  MITRAL VALVE MV Area (PHT): 3.12 cm    SHUNTS MV Decel Time: 243 msec    Systemic VTI:  0.20 m MV E velocity: 72.00 cm/s  Systemic Diam: 2.40 cm MV A velocity: 50.70 cm/s MV E/A ratio:  1.42  Jayson Sierras MD Electronically signed by Jayson Sierras MD Signature Date/Time: 10/07/2023/3:16:20 PM    Final  ______________________________________________________________________________________________      Risk Assessment/Calculations           Physical Exam VS:  BP 122/70 (BP Location: Left Arm, Patient Position: Sitting, Cuff Size: Large)   Pulse 74   Resp 16   Ht 6' (1.829 m)   Wt 270 lb 9.6 oz (122.7 kg)   SpO2 98%   BMI 36.70 kg/m        Wt Readings from Last 3 Encounters:  01/19/24 270 lb 9.6 oz (122.7 kg)  09/18/23 282 lb 11.2 oz (128.2 kg)    GEN: Well nourished, well developed in no acute distress NECK: No JVD; No carotid bruits CARDIAC: RRR, no murmurs, rubs, gallops RESPIRATORY:  Clear to auscultation without rales, wheezing or rhonchi  ABDOMEN: Soft, non-tender, non-distended EXTREMITIES:  No edema; No deformity   ASSESSMENT AND PLAN Assessment & Plan Chest pain Intermittent chest pain described as pressure, possibly with shortness of breath, improved post-vacation. Normal EKG and echocardiogram. Differential includes pericarditis, angina. Discussed possible coronary calcium score. As symptoms improved and normal EKG, prefers to monitor. - Monitor symptoms and ability to increase physical activity.  - Consider coronary calcium score if symptoms persist or worsen. - Order sed rate and CRP if symptoms do not improve. No pericardial rub on exam.  Palpitations Infrequent palpitations with increased heart rate, possibly stress, alcohol, or caffeine-related. No structural heart issues. - Monitor frequency  and severity of palpitations. - Reassess if palpitations become more frequent or severe. Could consider PRN beta blocker vs ZIO monitor for further evaluation. Overall not bothersome at this time.  Edema Mild edema likely due to salt intake and heat, improved post-vacation. Echo 09/2023 normal LVEF, normal diastolic function. - Monitor for recurrence of edema. - Advise on reducing salt intake and managing fluid retention.  HLD  No evidence of familial hyperlipidemia. The 10-year ASCVD risk score (Arnett DK, et al., 2019) is: 1.8%. Recommend aiming for 150 minutes of moderate intensity activity per week and following a heart healthy diet.    OSA CPAP compliance encouraged.         Dispo: follow up in 6 months  Signed, Reche GORMAN Finder, NP

## 2024-06-17 ENCOUNTER — Other Ambulatory Visit (HOSPITAL_BASED_OUTPATIENT_CLINIC_OR_DEPARTMENT_OTHER): Payer: Self-pay

## 2024-06-17 MED ORDER — OMEPRAZOLE 20 MG PO CPDR
20.0000 mg | DELAYED_RELEASE_CAPSULE | Freq: Every day | ORAL | 3 refills | Status: AC
  Filled 2024-06-17: qty 90, 90d supply, fill #0

## 2024-06-17 MED ORDER — DEXAMETHASONE 4 MG PO TABS
8.0000 mg | ORAL_TABLET | Freq: Every day | ORAL | 0 refills | Status: AC
  Filled 2024-06-17: qty 10, 5d supply, fill #0

## 2024-06-30 ENCOUNTER — Other Ambulatory Visit (HOSPITAL_BASED_OUTPATIENT_CLINIC_OR_DEPARTMENT_OTHER): Payer: Self-pay

## 2024-06-30 MED ORDER — SUMATRIPTAN SUCCINATE 100 MG PO TABS
100.0000 mg | ORAL_TABLET | ORAL | 3 refills | Status: AC
  Filled 2024-06-30: qty 9, 30d supply, fill #0

## 2024-06-30 MED ORDER — TESTOSTERONE CYPIONATE 200 MG/ML IM SOLN
200.0000 mg | INTRAMUSCULAR | 1 refills | Status: AC
  Filled 2024-06-30: qty 10, 34d supply, fill #0
  Filled 2024-07-05: qty 10, 100d supply, fill #0

## 2024-07-01 ENCOUNTER — Other Ambulatory Visit (HOSPITAL_BASED_OUTPATIENT_CLINIC_OR_DEPARTMENT_OTHER): Payer: Self-pay

## 2024-07-04 ENCOUNTER — Ambulatory Visit (HOSPITAL_BASED_OUTPATIENT_CLINIC_OR_DEPARTMENT_OTHER): Payer: Self-pay | Admitting: Family

## 2024-07-05 ENCOUNTER — Other Ambulatory Visit: Payer: Self-pay

## 2024-07-05 ENCOUNTER — Other Ambulatory Visit (HOSPITAL_BASED_OUTPATIENT_CLINIC_OR_DEPARTMENT_OTHER): Payer: Self-pay
